# Patient Record
Sex: Male | Born: 1960 | Race: White | Hispanic: No | Marital: Single | Smoking: Current every day smoker
Health system: Southern US, Community
[De-identification: ages and names within clinical notes are randomized; demographics above are authoritative.]

## PROBLEM LIST (undated history)

## (undated) DIAGNOSIS — M549 Dorsalgia, unspecified: Secondary | ICD-10-CM

## (undated) DIAGNOSIS — G8929 Other chronic pain: Secondary | ICD-10-CM

---

## 2014-02-25 ENCOUNTER — Emergency Department (INDEPENDENT_AMBULATORY_CARE_PROVIDER_SITE_OTHER): Payer: PRIVATE HEALTH INSURANCE

## 2014-02-25 ENCOUNTER — Encounter: Payer: Self-pay | Admitting: Emergency Medicine

## 2014-02-25 ENCOUNTER — Emergency Department
Admission: EM | Admit: 2014-02-25 | Discharge: 2014-02-25 | Disposition: A | Discharge status: Home / Self Care | Payer: PRIVATE HEALTH INSURANCE | Source: Home / Self Care | Attending: Emergency Medicine | Admitting: Emergency Medicine

## 2014-02-25 DIAGNOSIS — M549 Dorsalgia, unspecified: Secondary | ICD-10-CM

## 2014-02-25 DIAGNOSIS — M542 Cervicalgia: Secondary | ICD-10-CM

## 2014-02-25 DIAGNOSIS — IMO0002 Reserved for concepts with insufficient information to code with codable children: Secondary | ICD-10-CM

## 2014-02-25 DIAGNOSIS — S161XXA Strain of muscle, fascia and tendon at neck level, initial encounter: Secondary | ICD-10-CM

## 2014-02-25 DIAGNOSIS — S239XXA Sprain of unspecified parts of thorax, initial encounter: Secondary | ICD-10-CM

## 2014-02-25 DIAGNOSIS — S139XXA Sprain of joints and ligaments of unspecified parts of neck, initial encounter: Secondary | ICD-10-CM

## 2014-02-25 DIAGNOSIS — S335XXA Sprain of ligaments of lumbar spine, initial encounter: Secondary | ICD-10-CM

## 2014-02-25 DIAGNOSIS — S39012A Strain of muscle, fascia and tendon of lower back, initial encounter: Secondary | ICD-10-CM

## 2014-02-25 HISTORY — DX: Other chronic pain: G89.29

## 2014-02-25 HISTORY — DX: Dorsalgia, unspecified: M54.9

## 2014-02-25 MED ORDER — MELOXICAM 7.5 MG PO TABS: ORAL_TABLET | ORAL | Status: AC

## 2014-02-25 MED ORDER — CARISOPRODOL 350 MG PO TABS: ORAL_TABLET | ORAL | Status: AC

## 2014-02-25 MED ORDER — HYDROCODONE-ACETAMINOPHEN 10-300 MG PO TABS: 1.0000 | ORAL_TABLET | Freq: Four times a day (QID) | ORAL | Status: AC | PRN
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
## 2015-03-04 IMAGING — CR DG LUMBAR SPINE COMPLETE 4+V
5 series · 5 of 5 positions shown · non-contrast
Comparison: None.

CLINICAL DATA: Pain post trauma

EXAM:
LUMBAR SPINE - COMPLETE 4+ VIEW

[view not recorded (1 of 5)]
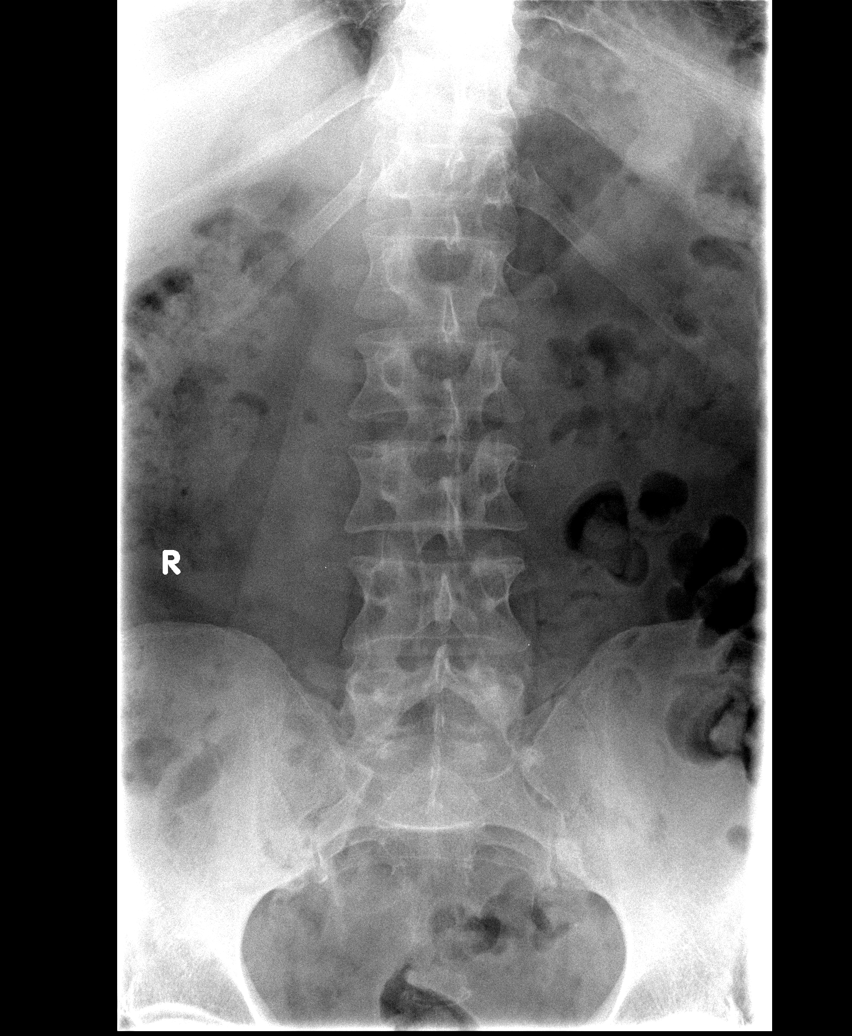

[view not recorded (2 of 5)]
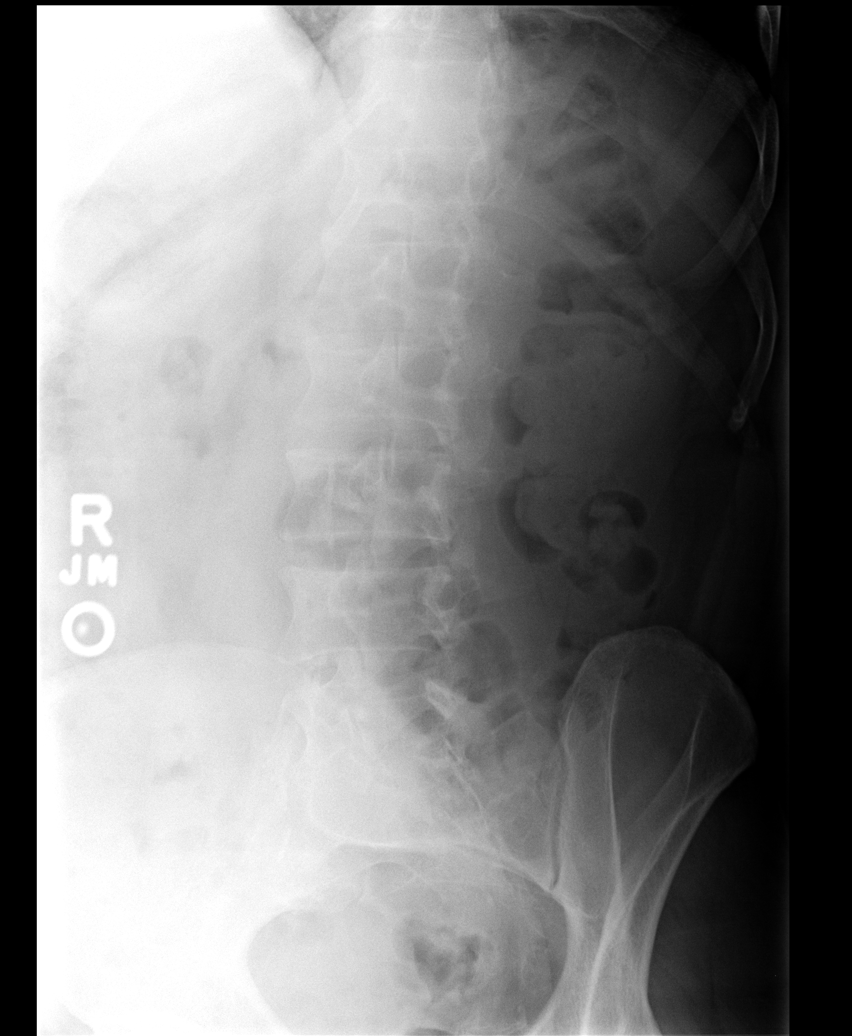

[view not recorded (3 of 5)]
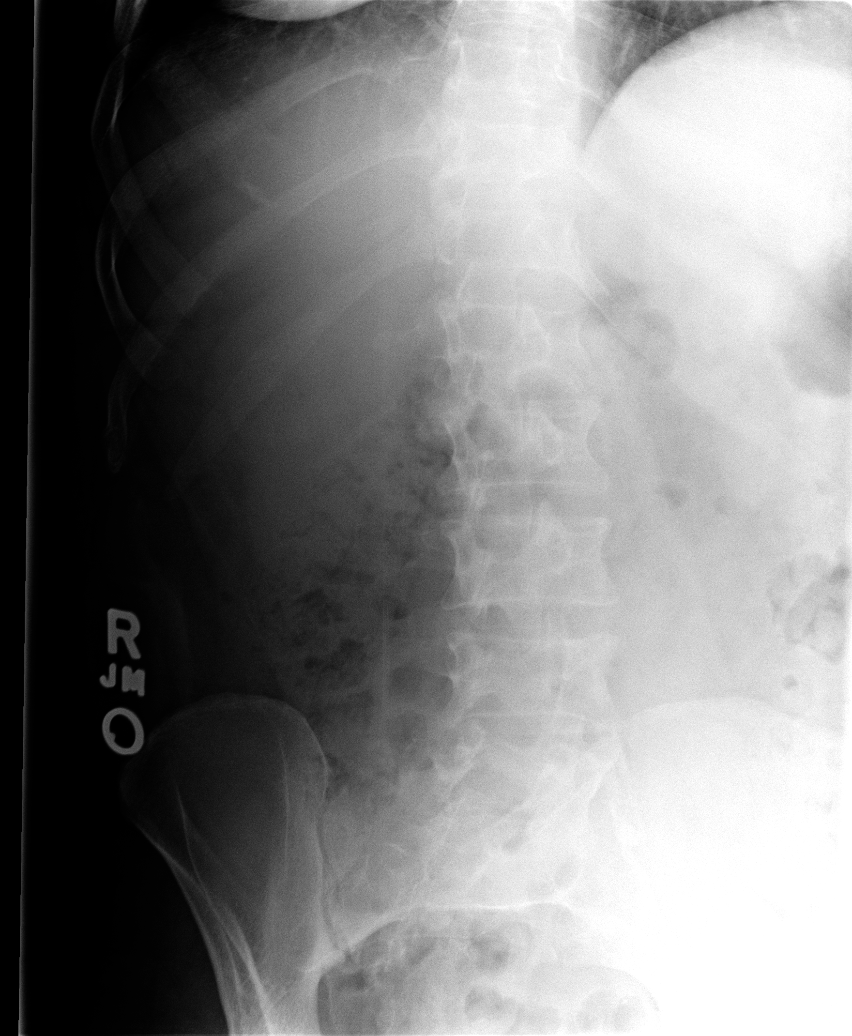

[view not recorded (4 of 5)]
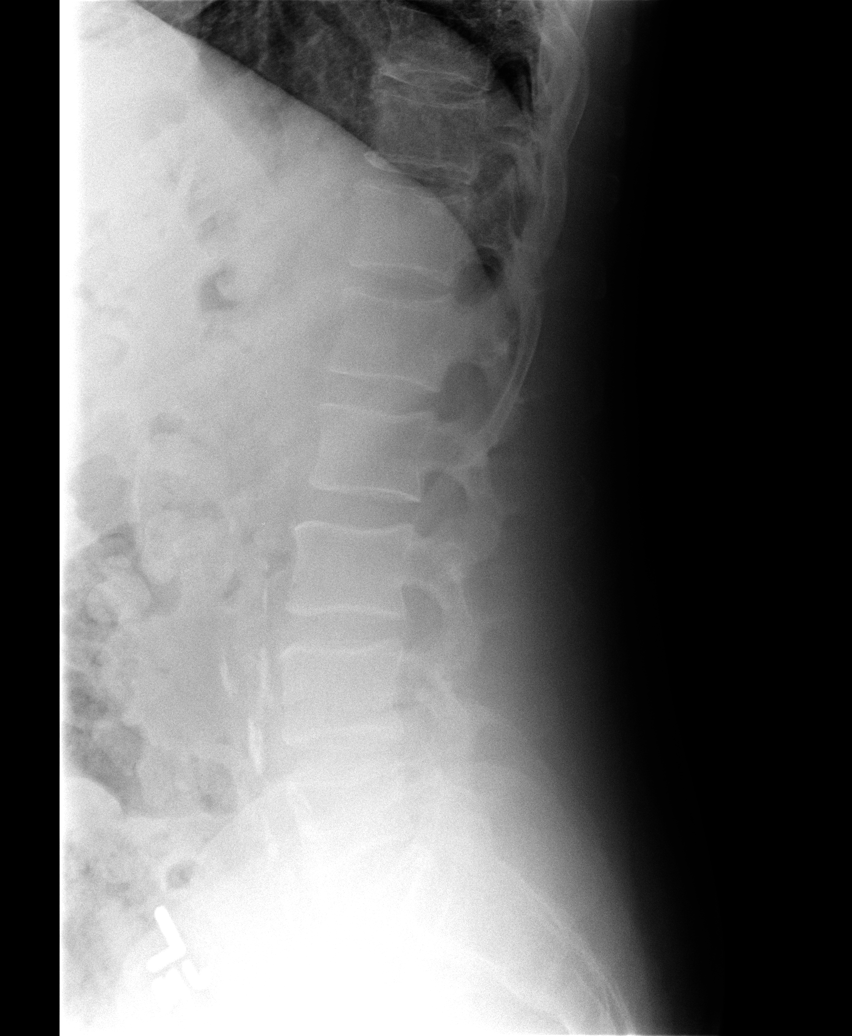

[view not recorded (5 of 5)]
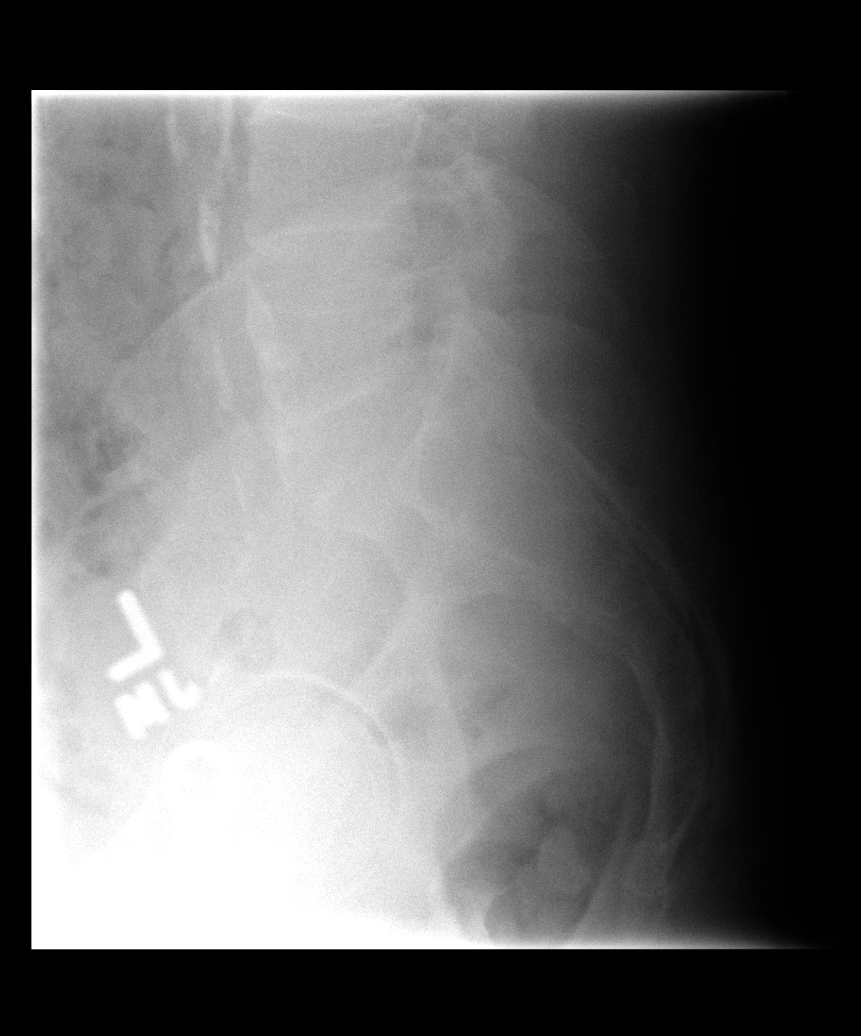

[5 of 5 positions shown; findings below may reference images not displayed]

FINDINGS: Frontal, lateral, spot lumbosacral lateral, and bilateral oblique
views were obtained. There are 5 non-rib-bearing lumbar type
vertebral bodies. There is no fracture or spondylolisthesis. There
is mild disc space narrowing at L4-5. Other disc spaces appear
normal. There is no appreciable facet arthropathy. There is
atherosclerotic change in the aorta.
IMPRESSION: Mild disc space narrowing at L4-5. No fracture or spondylolisthesis.
There is atherosclerotic calcification in the aorta.
